# Patient Record
Sex: Female | Born: 1995 | Race: White | Hispanic: No | Smoking: Never smoker
Health system: Southern US, Community
[De-identification: ages and names within clinical notes are randomized; demographics above are authoritative.]

---

## 2019-09-22 ENCOUNTER — Other Ambulatory Visit: Payer: Self-pay

## 2019-09-24 ENCOUNTER — Ambulatory Visit (INDEPENDENT_AMBULATORY_CARE_PROVIDER_SITE_OTHER): Payer: 59 | Admitting: Internal Medicine

## 2019-09-24 ENCOUNTER — Encounter: Payer: Self-pay | Admitting: Internal Medicine

## 2019-09-24 ENCOUNTER — Other Ambulatory Visit: Payer: Self-pay

## 2019-09-24 VITALS — BP 118/76 | HR 112 | Ht 68.0 in | Wt 170.0 lb

## 2019-09-24 DIAGNOSIS — E221 Hyperprolactinemia: Secondary | ICD-10-CM

## 2019-09-24 DIAGNOSIS — D352 Benign neoplasm of pituitary gland: Secondary | ICD-10-CM

## 2019-09-24 LAB — T4, FREE: Free T4: 0.71 ng/dL (ref 0.60–1.60)

## 2019-09-24 LAB — T3, FREE: T3, Free: 3.7 pg/mL (ref 2.3–4.2)

## 2019-09-24 LAB — CORTISOL: Cortisol, Plasma: 17 ug/dL

## 2019-09-24 LAB — TSH: TSH: 1.42 u[IU]/mL (ref 0.35–4.50)

## 2019-09-24 NOTE — Progress Notes (Signed)
Patient ID: Joanne Owens, female   DOB: 01-20-1996, 24 y.o.   MRN: [id number]   This visit occurred during the SARS-CoV-2 public health emergency.  Safety protocols were in place, including screening questions prior to the visit, additional usage of staff PPE, and extensive cleaning of exam room while observing appropriate contact time as indicated for disinfecting solutions.   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  Pt. has been found to have a high prolactin Owens (130) in 10/2016 after she complained of breast discharge and 1 missed period.  She was referred to endocrinology at that time.  She started to see Dr. Iran Owens in 11/2016 (reviewed his notes and corresponding labs).   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  He investigated the rest of her pituitary hormones and they were normal.  These were last checked on 11/01/2018.  At that time, prolactin was normal, at 8.1.  Of note, after diagnosis, she was started on cabergoline 0.5 mg twice weekly.  She is tolerating this well.  Patient denies: -Headaches -Irregular menstrual cycles -Galactorrhea -Weight gain -Acne  Other pertinent tests reviewed:    TSH    0.829  1.639    She is not on Risperdal, Reglan.  She is on OCPs up to 2018 >> had Provera but did not needs this at the end. She had regular menses afterwards, but started OCP at the end on 2018.  However, she is interested in a pregnancy in the near future.  Pt. also has a history of anxiety and depression, for which she sees a Social worker.  She walks for exercise 3 times a week.  ROS: Constitutional: + weight gain (~10 lbs during the coronavirus  pandemic), no weight loss, no fatigue, no subjective hyperthermia, no subjective hypothermia, no nocturia Eyes: no blurry vision, no xerophthalmia ENT: no sore throat, no nodules felt in neck, no dysphagia, no odynophagia, no hoarseness, no tinnitus, no hypoacusis Cardiovascular: no CP, no SOB, no palpitations, no leg swelling Respiratory: no cough, no SOB, no wheezing Gastrointestinal: no N, no V, no D, no C, no acid reflux Musculoskeletal: no muscle, + joint aches Skin: no rash, no hair loss Neurological: no tremors, no numbness or tingling/no dizziness/no HAs Psychiatric: no depression, no anxiety  History reviewed. No pertinent past medical history. History reviewed. No pertinent surgical history. Social History   Socioeconomic History   Marital status: Significant Other    Spouse name: Not on file   Number of children: 0   Years of education: Not on file   Highest education Owens: Not on file  Occupational History   N/a  Tobacco Use   Smoking status: Never Smoker   Smokeless tobacco: Never Used  Substance and Sexual Activity   Alcohol use:  Red wine 1 glass weekly   Drug use: No   Social Determinants of Health   Financial Resource Strain:    Difficulty of Paying Living Expenses: Not on file  Food Insecurity:    Worried About [place] in the Last Year: Not on file   Ran Out of Food in the Last Year: Not on file  Transportation Needs:    Lack of Transportation (Medical): Not on file   Lack of Transportation (Non-Medical): Not  on file  Physical Activity:    Days of Exercise per Week: Not on file   Minutes of Exercise per Session: Not on file  Stress:    Feeling of Stress : Not on file  Social Connections:    Frequency of Communication with Friends and Family: Not on file   Frequency of Social Gatherings with Friends and Family: Not on file   Attends Religious Services: Not on file   Active Member of Clubs or Organizations: Not on file    Attends Archivist Meetings: Not on file   Marital Status: Not on file  Intimate Partner Violence:    Fear of Current or Ex-Partner: Not on file   Emotionally Abused: Not on file   Physically Abused: Not on file   Sexually Abused: Not on file   Current Outpatient Medications on File Prior to Visit  Medication Sig Dispense Refill   cabergoline (DOSTINEX) 0.5 MG tablet Take 0.25 mg by mouth 2 (two) times a week.     escitalopram (LEXAPRO) 10 MG tablet Take by mouth.     KURVELO 0.15-30 MG-MCG tablet Take 1 tablet by mouth daily.     No current facility-administered medications on file prior to visit.   No Known Allergies   History reviewed. No pertinent family history.  PE: BP 118/76    Pulse (!) 112    Ht 5\' 8"  (1.727 m)    Wt 170 lb (77.1 kg)    LMP 09/04/2019    SpO2 96%    BMI 25.85 kg/m  Wt Readings from Last 3 Encounters:  09/24/19 170 lb (77.1 kg)   Constitutional: overweight, in NAD Eyes: PERRLA, EOMI, no exophthalmos ENT: moist mucous membranes, no thyromegaly, no cervical lymphadenopathy Cardiovascular: Tachycardia RR, No MRG Respiratory: CTA B Gastrointestinal: abdomen soft, NT, ND, BS+ Musculoskeletal: no deformities, strength intact in all 4 Skin: moist, warm, no rashes Neurological: no tremor with outstretched hands, DTR normal in all 4  ASSESSMENT: 1.  Hyperprolactinemia - Patient with 3 years history of hyperprolactinemia, diagnosed due to amenorrhea after stopping OCPs in 2018. - I discussed with the patient about possible etiologies of high prolactin levels (to include pituitary adenoma, but also possibly pregnancy, hypothyroidism, stress, exercise, lack of sleep, certain medications, seizures, liver or kidney disease, etc.).  In her case, her pituitary macroadenoma is likely the source of the high prolactin. - we will recheck prolactin today, along with TFTs - We discussed about possible treatments for hyperprolactinemia: Cabergoline and  bromocriptine.  Cabergoline has the advantage of being administered usually 1-2 times a week, is better tolerated, and has beneficial effects on pituitary tumor size.  Bromocriptine is taken several times a day, has more side effects, and has no effect on pituitary tumor size.  She is tolerating Cabergoline well but I would like to try to reduce the dose to only once a week if possible.  I will let her know after the results today return. 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  Her prolactin Owens was 130, which is a little lower than I would have expected from the size of her tumor. - I reviewed the report of the pituitary MRI along with the patient (images are not available to me). I explained that, since the tumor is larger than 1  cm, this qualifies as a macro-, rather than a micro-adenoma.  -This is not a "brain tumor". These tumors are extremely rarely malignant, the vast majority of them are benign.  - a pituitary adenoma can be:   Not producing hormones, not compressing the pituitary gland or the optic chiasm  Not producing hormones, but compressing either the pituitary gland (causing hypopituitarism) or the optic chiasm (causing visual field cuts)  Producing hormones: Prolactinoma, Cushing's disease, acromegaly, gonadotropin secreting tumor, TSH secreting tumor (very rare) - Her prolactin Owens was elevated and the rest of her pituitary hormones were normal, which points towards a prolactin producing tumor, however, I would like to recheck the rest of her pituitary labs to make sure there is no concomitant hormonal dysfunction.  Also, I would like to check a prolactin with dilution since I would have expected a higher prolactin Owens based on the size of her tumor. - I ordered the following  labs: Orders Placed This Encounter  Procedures   MR Brain W Wo Contrast   Insulin-like growth factor   T3, free   T4, free   TSH   ACTH   Cortisol   PROLACTIN W/DILUTION  -Also, we will check another pituitary MRI for 2 reasons: If the pituitary tumor decreased in size, this is most likely a prolactinoma and we can continue with the Cabergoline treatment.  If the tumor did not decrease in size.  It is possible that it may be a nonfunctional adenoma, which may need to be resected. -She is also interested in a pregnancy so I would like to have a baseline of her pituitary tumor size before the pregnancy. -I did advise her that if she is pregnant, she needs to stop cabergoline immediately.  If her tumor remains large, we may need to have visual fields before pregnancy and then in every trimester.  Component     Latest Ref Rng & Units 09/24/2019  IGF-I, LC/MS     83 - 456 ng/mL 208  Z-Score (Female)     -2.0 - 2 SD -0.3  PROLACTIN,UNDILUTED     2.0 - 30.0 ng/mL 3.3  PROLACTIN,DILUTED     ng/mL   Extra tube recieved        Specimen type recieved      Serum;  Triiodothyronine,Free,Serum     2.3 - 4.2 pg/mL 3.7  T4,Free(Direct)     0.60 - 1.60 ng/dL 0.71  TSH     0.35 - 4.50 uIU/mL 1.42  C206 ACTH     6 - 50 pg/mL 11  Cortisol, Plasma     ug/dL 17.0   Her pituitary tests are normal and prolactin is low.  We can back off her cabergoline to 0.25 mg once weekly and repeat her prolactin Owens in 2 months. We will also wait for the results of her MRI.  Philemon Kingdom, MD PhD [facility]

## 2019-09-24 NOTE — Patient Instructions (Signed)
Please stop at the lab.  We will try to get a new MRI.  Continue Cabergoline 0.25 mg 2x a week for now.  Please come back for a follow-up appointment in 1 year.

## 2019-09-30 ENCOUNTER — Encounter: Payer: Self-pay | Admitting: Internal Medicine

## 2019-09-30 LAB — INSULIN-LIKE GROWTH FACTOR
IGF-I, LC/MS: 208 ng/mL (ref 83–456)
Z-Score (Female): -0.3 SD (ref ?–2.0)

## 2019-09-30 LAB — EXTRA SPECIMEN

## 2019-09-30 LAB — PROLACTIN W/DILUTION: PROLACTIN,UNDILUTED: 3.3 ng/mL (ref 2.0–30.0)

## 2019-09-30 LAB — ACTH: C206 ACTH: 11 pg/mL (ref 6–50)

## 2019-10-02 ENCOUNTER — Telehealth: Payer: Self-pay

## 2019-10-02 NOTE — Telephone Encounter (Signed)
-----   Message from Philemon Kingdom, MD sent at 09/30/2019  1:50 PM EST ----- Joanne Owens, can you please call pt:  All her pituitary tests are normal, which is excellent.  Her prolactin is actually quite low, is 3.3, so we can reduce her cabergoline dose to only 0.25 mg weekly.  After this change, I would like to repeat her prolactin level (if she does not become pregnant) in 2 months. I will let her know as soon as her pituitary MRI returns.

## 2019-10-02 NOTE — Telephone Encounter (Signed)
Lab results reviewed by Dr. Cruzita Lederer. Called pt to inform about lab results as well as new orders. LVM requesting returned call. Also wanted to schedule lab appt for follow up labs.

## 2019-10-25 ENCOUNTER — Other Ambulatory Visit: Payer: 59

## 2019-10-30 ENCOUNTER — Ambulatory Visit: Payer: 59

## 2019-10-31 ENCOUNTER — Ambulatory Visit: Payer: 59 | Attending: Internal Medicine

## 2019-10-31 DIAGNOSIS — Z23 Encounter for immunization: Secondary | ICD-10-CM

## 2019-10-31 NOTE — Progress Notes (Signed)
   Covid-19 Vaccination Clinic  Name:  Joanne Owens    MRN: [id number] DOB: 1995-12-13  10/31/2019  [name] was observed post Covid-19 immunization for 15 minutes without incident. She was provided with Vaccine Information Sheet and instruction to access the V-Safe system.   [name] was instructed to call 911 with any severe reactions post vaccine: Marland Kitchen Difficulty breathing  . Swelling of face and throat  . A fast heartbeat  . A bad rash all over body  . Dizziness and weakness   Immunizations Administered    Name Date Dose VIS Date Route   Pfizer COVID-19 Vaccine 10/31/2019  1:10 PM 0.3 mL 07/25/2019 Intramuscular   Manufacturer: [place]   Lot: [id number]   [place]: [id number]

## 2019-11-18 ENCOUNTER — Encounter: Payer: Self-pay | Admitting: Internal Medicine

## 2019-11-19 ENCOUNTER — Other Ambulatory Visit: Payer: Self-pay

## 2019-11-19 DIAGNOSIS — D352 Benign neoplasm of pituitary gland: Secondary | ICD-10-CM

## 2019-11-19 DIAGNOSIS — E221 Hyperprolactinemia: Secondary | ICD-10-CM

## 2019-11-19 MED ORDER — CABERGOLINE 0.5 MG PO TABS: ORAL_TABLET | ORAL | 0 refills | Status: DC

## 2019-11-26 ENCOUNTER — Ambulatory Visit: Payer: 59 | Attending: Internal Medicine

## 2019-11-26 DIAGNOSIS — Z23 Encounter for immunization: Secondary | ICD-10-CM

## 2019-11-26 NOTE — Progress Notes (Signed)
   Covid-19 Vaccination Clinic  Name:  Shericka Swayze    MRN: [id number] DOB: [birth year]  11/26/2019  Ms. Bjelland was observed post Covid-19 immunization for 15 minutes without incident. She was provided with Vaccine Information Sheet and instruction to access the V-Safe system.   Ms. Leibovitz was instructed to call 911 with any severe reactions post vaccine: Marland Kitchen Difficulty breathing  . Swelling of face and throat  . A fast heartbeat  . A bad rash all over body  . Dizziness and weakness   Immunizations Administered    Name Date Dose VIS Date Route   Pfizer COVID-19 Vaccine 11/26/2019  3:10 PM 0.3 mL 07/25/2019 Intramuscular   Manufacturer: [place]   Lot: [id number]   [place]: [id number]

## 2020-01-13 ENCOUNTER — Ambulatory Visit
Admission: RE | Admit: 2020-01-13 | Discharge: 2020-01-13 | Disposition: A | Discharge status: Home / Self Care | Payer: 59 | Source: Ambulatory Visit | Attending: Internal Medicine | Admitting: Internal Medicine

## 2020-01-13 ENCOUNTER — Encounter: Payer: Self-pay | Admitting: Internal Medicine

## 2020-01-13 ENCOUNTER — Other Ambulatory Visit: Payer: Self-pay

## 2020-01-13 DIAGNOSIS — D352 Benign neoplasm of pituitary gland: Secondary | ICD-10-CM

## 2020-01-13 DIAGNOSIS — E221 Hyperprolactinemia: Secondary | ICD-10-CM

## 2020-01-13 MED ORDER — GADOBENATE DIMEGLUMINE 529 MG/ML IV SOLN
7.0000 mL | Freq: Once | INTRAVENOUS | Status: AC | PRN
  Administered 2020-01-13: 7 mL via INTRAVENOUS

## 2020-01-29 ENCOUNTER — Other Ambulatory Visit: Payer: Self-pay

## 2020-01-29 ENCOUNTER — Other Ambulatory Visit (INDEPENDENT_AMBULATORY_CARE_PROVIDER_SITE_OTHER): Payer: 59

## 2020-01-29 DIAGNOSIS — E221 Hyperprolactinemia: Secondary | ICD-10-CM | POA: Diagnosis not present

## 2020-01-29 DIAGNOSIS — D352 Benign neoplasm of pituitary gland: Secondary | ICD-10-CM

## 2020-01-29 LAB — PROLACTIN: Prolactin: 6.2 ng/mL

## 2020-02-04 ENCOUNTER — Other Ambulatory Visit: Payer: Self-pay | Admitting: Internal Medicine

## 2020-02-04 DIAGNOSIS — E221 Hyperprolactinemia: Secondary | ICD-10-CM

## 2020-02-04 DIAGNOSIS — D352 Benign neoplasm of pituitary gland: Secondary | ICD-10-CM

## 2020-02-04 MED ORDER — CABERGOLINE 0.5 MG PO TABS: ORAL_TABLET | ORAL | 1 refills | Status: DC

## 2020-04-21 ENCOUNTER — Other Ambulatory Visit: Payer: Self-pay | Admitting: Internal Medicine

## 2020-04-21 DIAGNOSIS — E221 Hyperprolactinemia: Secondary | ICD-10-CM

## 2020-04-21 DIAGNOSIS — D352 Benign neoplasm of pituitary gland: Secondary | ICD-10-CM

## 2020-08-16 ENCOUNTER — Other Ambulatory Visit: Payer: Self-pay | Admitting: Internal Medicine

## 2020-08-16 DIAGNOSIS — D352 Benign neoplasm of pituitary gland: Secondary | ICD-10-CM

## 2020-08-16 DIAGNOSIS — E221 Hyperprolactinemia: Secondary | ICD-10-CM

## 2020-09-23 ENCOUNTER — Ambulatory Visit: Payer: 59 | Admitting: Internal Medicine

## 2020-11-10 ENCOUNTER — Ambulatory Visit: Payer: 59 | Admitting: Internal Medicine

## 2020-12-17 ENCOUNTER — Ambulatory Visit: Payer: 59 | Admitting: Internal Medicine

## 2020-12-29 ENCOUNTER — Encounter: Payer: Self-pay | Admitting: Internal Medicine

## 2020-12-29 DIAGNOSIS — E221 Hyperprolactinemia: Secondary | ICD-10-CM

## 2020-12-29 DIAGNOSIS — D352 Benign neoplasm of pituitary gland: Secondary | ICD-10-CM

## 2020-12-30 MED ORDER — CABERGOLINE 0.5 MG PO TABS: ORAL_TABLET | ORAL | 0 refills | Status: DC

## 2021-01-13 MED ORDER — CABERGOLINE 0.5 MG PO TABS: ORAL_TABLET | ORAL | 0 refills | Status: AC

## 2021-01-13 NOTE — Addendum Note (Signed)
Addended by: Lauralyn Primes on: 01/13/2021 08:01 PM   Modules accepted: Orders

## 2021-02-11 ENCOUNTER — Ambulatory Visit (INDEPENDENT_AMBULATORY_CARE_PROVIDER_SITE_OTHER): Payer: Medicaid Other | Admitting: Internal Medicine

## 2021-02-11 ENCOUNTER — Encounter: Payer: Self-pay | Admitting: Internal Medicine

## 2021-02-11 ENCOUNTER — Other Ambulatory Visit: Payer: Self-pay

## 2021-02-11 VITALS — BP 110/62 | HR 86 | Ht 68.0 in | Wt 171.0 lb

## 2021-02-11 DIAGNOSIS — D352 Benign neoplasm of pituitary gland: Secondary | ICD-10-CM | POA: Diagnosis not present

## 2021-02-11 DIAGNOSIS — E221 Hyperprolactinemia: Secondary | ICD-10-CM

## 2021-02-11 NOTE — Patient Instructions (Addendum)
Please stop at the lab.  Continue Cabergoline 0.25 mg 2x a week.  Please come back for a follow-up appointment in 1 year. 

## 2021-02-11 NOTE — Progress Notes (Signed)
Patient ID: Joanne Owens, female   DOB: 12-16-1995, 25 y.o.   MRN: [id number]   This visit occurred during the SARS-CoV-2 public health emergency.  Safety protocols were in place, including screening questions prior to the visit, additional usage of staff PPE, and extensive cleaning of exam room while observing appropriate contact time as indicated for disinfecting solutions.   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  Last visit 1 year and 4 months ago.  Interim history: She has been doing well since last visit, without complaints.  She came off OCPs and her cycles are regular. At last visit, we tried to decrease the Cabergoline dose to half a tablet once a week but she developed some galactorrhea and went back to twice a week.  She continues on this dose.  No headaches, visual disturbance, or galactorrhea on this dose.  Reviewed and addended history: Pt. has been found to have a high prolactin level (130) in 10/2016 after she complained of breast discharge and 1 missed period.  She was referred to endocrinology at that time.  She started to see Dr. Iran Planas in 11/2016 (reviewed his notes and corresponding labs).   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  He investigated the rest of her pituitary hormones and they were normal in 11/01/2018.  At that time, prolactin was normal, at 8.1.  Of note, after diagnosis, she was started on cabergoline 0.5 mg twice weekly.    When I saw her first, I ordered a pituitary MRI (01/13/2020): Pituitary tumor significantly decreased in size, now measuring only 4 mm.  Also, all pituitary labs were again normal in 09/2019, including a prolactin level, which  was actually low: Component     Latest Ref Rng & Units 09/24/2019  IGF-I, LC/MS     83 - 456 ng/mL 208  Z-Score (Female)     -2.0 - 2 SD -0.3  PROLACTIN,UNDILUTED     2.0 - 30.0 ng/mL 3.3  PROLACTIN,DILUTED     ng/mL   Extra tube recieved        Specimen type recieved      Serum;  Triiodothyronine,Free,Serum     2.3 - 4.2 pg/mL 3.7  T4,Free(Direct)     0.60 - 1.60 ng/dL 0.71  TSH     0.35 - 4.50 uIU/mL 1.42  C206 ACTH     6 - 50 pg/mL 11  Cortisol, Plasma     ug/dL 17.0  She did not have evidence of macroprolactin.  We decreased her Cabergoline dose to 0.25 mg weekly.  However, she developed breast discharge and increase the dose back to twice weekly She continues on this dose.  She is off OCPs >> regular menses.  Patient denies: -Headaches -Galactorrhea -Weight gain -Acne  Other pertinent tests reviewed:    TSH    0.829  1.639    She is not on Risperdal, Reglan.  She was on OCPs up to 2018 >> had Provera but did not needs this at the end. She had regular menses afterwards, but started OCP at the end on 2018.  Now off again.  However, she is interested in a pregnancy in the near future-now using condoms.  Pt. also has a history of anxiety and depression, for which  she sees a Social worker.  She walks for exercise 3 times a week.  ROS: Constitutional: No weight gain, no weight loss, no fatigue, no subjective hyperthermia, no subjective hypothermia, no nocturia Eyes: no blurry vision, no xerophthalmia ENT: no sore throat, no nodules felt in neck, no dysphagia, no odynophagia, no hoarseness, no tinnitus, no hypoacusis Cardiovascular: no CP, no SOB, no palpitations, no leg swelling Respiratory: no cough, no SOB, no wheezing Gastrointestinal: no N, no V, no D, no C, no acid reflux Musculoskeletal: no muscle, no joint aches Skin: no rash, no hair loss Neurological: no tremors, no numbness or tingling/no dizziness/no HAs  No past medical history on file. No past  surgical history on file. Social History   Socioeconomic History   Marital status: Significant Other    Spouse name: Not on file   Number of children: 0   Years of education: Not on file   Highest education level: Not on file  Occupational History   N/a  Tobacco Use   Smoking status: Never Smoker   Smokeless tobacco: Never Used  Substance and Sexual Activity   Alcohol use:  Red wine 1 glass weekly   Drug use: No   Social Determinants of Health   Financial Resource Strain:    Difficulty of Paying Living Expenses: Not on file  Food Insecurity:    Worried About [place] in the Last Year: Not on file   Ran Out of Food in the Last Year: Not on file  Transportation Needs:    Lack of Transportation (Medical): Not on file   Lack of Transportation (Non-Medical): Not on file  Physical Activity:    Days of Exercise per Week: Not on file   Minutes of Exercise per Session: Not on file  Stress:    Feeling of Stress : Not on file  Social Connections:    Frequency of Communication with Friends and Family: Not on file   Frequency of Social Gatherings with Friends and Family: Not on file   Attends Religious Services: Not on file   Active Member of Clubs or Organizations: Not on file   Attends Archivist Meetings: Not on file   Marital Status: Not on file  Intimate Partner Violence:    Fear of Current or Ex-Partner: Not on file   Emotionally Abused: Not on file   Physically Abused: Not on file   Sexually Abused: Not on file   Current Outpatient Medications on File Prior to Visit  Medication Sig   cabergoline (DOSTINEX) 0.5 MG tablet TAKE 1/2 TABLET(0.25 MG) BY MOUTH 2x EVERY WEEK   escitalopram (LEXAPRO) 10 MG tablet Take by mouth.   No current facility-administered medications on file prior to visit.   No Known Allergies   No family history on file.  PE: BP 110/62   Pulse 86   Ht 5\' 8"  (1.727 m)   Wt 171 lb (77.6 kg)   LMP 01/23/2021   SpO2 96%   BMI  26.00 kg/m  Wt Readings from Last 3 Encounters:  02/11/21 171 lb (77.6 kg)  09/24/19 170 lb (77.1 kg)   Constitutional: overweight, in NAD Eyes: PERRLA, EOMI, no exophthalmos ENT: moist mucous membranes, no thyromegaly, no cervical lymphadenopathy Cardiovascular: RRR, No MRG Respiratory: CTA B Gastrointestinal: abdomen soft, NT, ND, BS+ Musculoskeletal: no deformities, strength intact in all 4 Skin: moist, warm, no rashes Neurological: no tremor with outstretched hands, DTR normal in all 4  ASSESSMENT: 1.  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  Pituitary hormones are otherwise normal. -We checked her for macro prolactin and investigation was negative -She was started on Cabergoline 0.5 mg weekly initially, which we decreased at last visit 0.25 mg weekly.  Due to some galactorrhea she increase the dose back to 0.5 mg weekly >> she continues on this dose. -no galactorrhea today and menses are regular -We will recheck the prolactin level today  2. Pituitary adenoma -Patient with history of pituitary microadenoma, as mentioned above, found during investigation for high prolactin level.  Initial prolactin level was 130. -She was started on Cabergoline which was able to shrink the size of her tumor -On the most recent MRI from 01/2020, her pituitary tumor only measured 4 mm, decreased from 1.3 cm.  I explained that this is an excellent result. -Indeed, her prolactin level decreased significantly and it was low at last visit, after which we decreased the Cabergoline dose to 0.25 mg weekly.  Interestingly, she had galactorrhea on this, as mentioned above so she increase the dose to 0.25 milligrams twice a week. -Since the rest of the pituitary hormones were normal, this tumor does not appear to be  secreting other hormones, for example growth hormone -no HAs, visual field cuts -At today's visit, we will repeat her prolactin level -We do not need to repeat her MRI for now, especially if prolactin level remains low -At last visit she was interested in pregnancy.  She continues to be interested in this.  I advised her to stop Cabergoline immediately after she finds out she is pregnant -No MRIs are indicated during the pregnancy, however, if she starts having headaches or other signs of hyperprolactinemia, she may need to have a visual field checked.  Component     Latest Ref Rng & Units 02/11/2021  Prolactin     ng/mL 9.8  Normal prolactin level.  For now, I would suggest to continue the current dose of Cabergoline.  Philemon Kingdom, MD PhD [facility]

## 2021-02-12 LAB — PROLACTIN: Prolactin: 9.8 ng/mL

## 2021-02-21 ENCOUNTER — Encounter: Payer: Self-pay | Admitting: Internal Medicine

## 2021-06-26 IMAGING — MR MR HEAD WO/W CM
14 of 19 series · 33 of 48 positions shown · IV contrast (7ml multihance)
Comparison: 11/28/2016.

CLINICAL DATA: 23-year-old female with hyperprolactinemia,
pituitary macroadenoma on 6936 MRI.

EXAM:
MRI HEAD WITHOUT AND WITH CONTRAST
TECHNIQUE: Multiplanar, multiecho pulse sequences of the brain and surrounding
structures were obtained without and with intravenous contrast.
CONTRAST:  7mL MULTIHANCE GADOBENATE DIMEGLUMINE 529 MG/ML IV SOLN

[Series 3: T1 · sagittal · 5.0mm · 0.45mm/px · 3 of 22 slices shown]
[im 1/22]
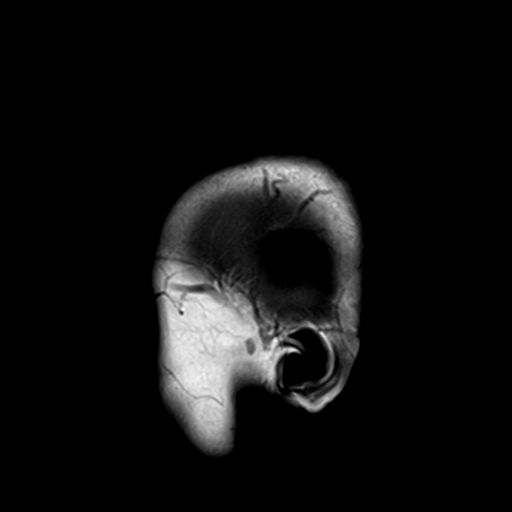
[im 11/22]
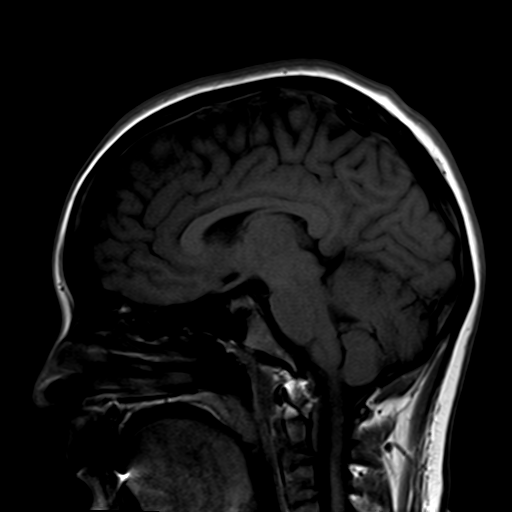
[im 22/22]
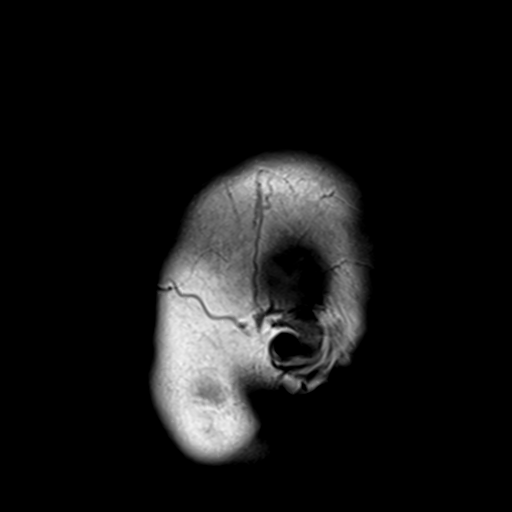

[Series 4: DWI · axial · 3.0mm · 1.80mm/px · z∈[-49,+98]mm · 8 of 100 slices shown]
[im 1/100]
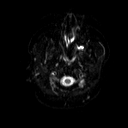
[im 12/100]
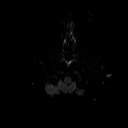
[im 34/100]
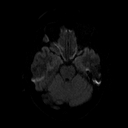
[im 45/100]
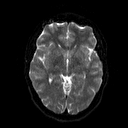
[im 56/100]
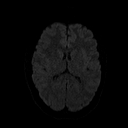
[im 67/100]
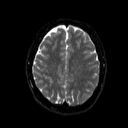
[im 89/100]
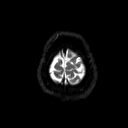
[im 100/100]
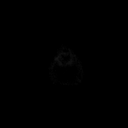

[Series 5: dwi_adc · axial · 3.0mm · 1.80mm/px · z∈[-49,+98]mm · 5 of 49 slices shown]
[im 1/49]
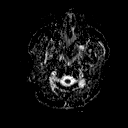
[im 13/49]
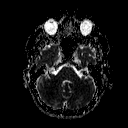
[im 25/49]
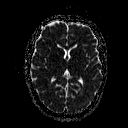
[im 37/49]
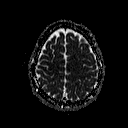
[im 49/49]
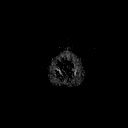

[Series 6: T2 · axial · 5.0mm · 0.36mm/px · z∈[-57,+92]mm · 2 of 24 slices shown]
[im 1/24]
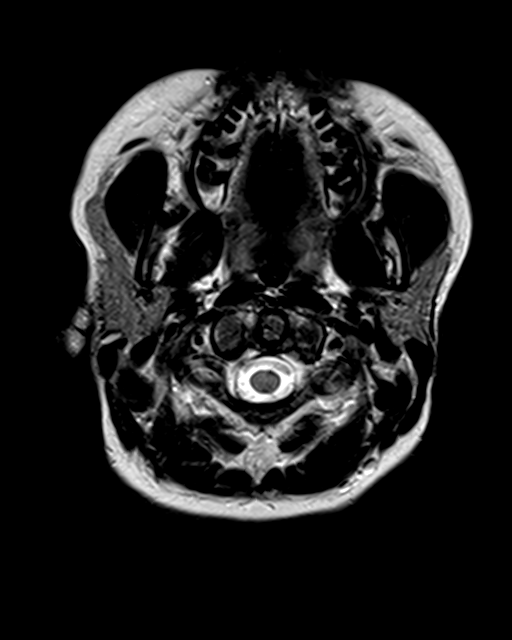
[im 24/24]
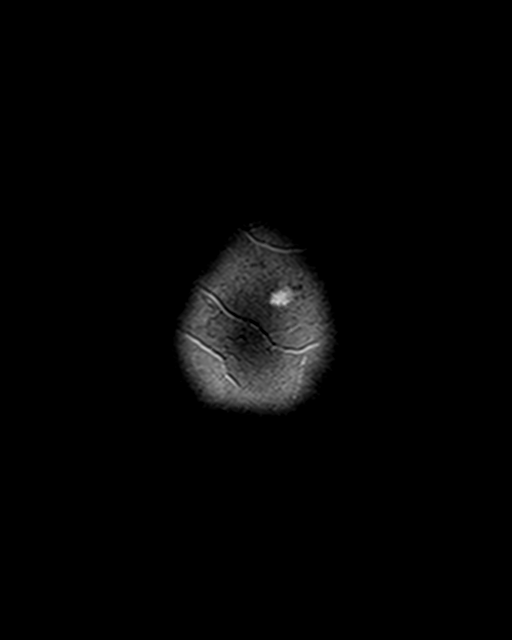

[Series 7: FLAIR · axial · 3.0mm · 0.45mm/px · z∈[-67,+77]mm · 3 of 32 slices shown]
[im 1/32]
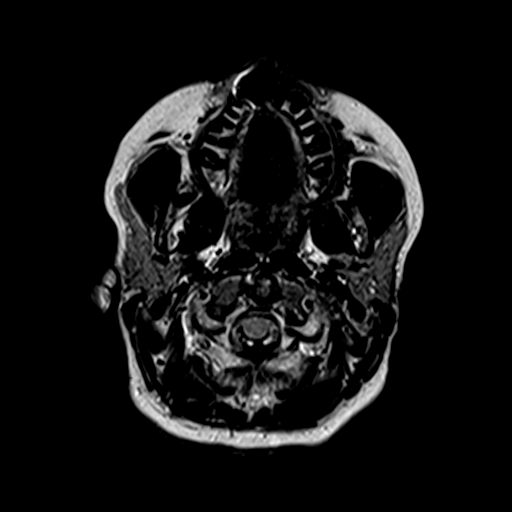
[im 16/32]
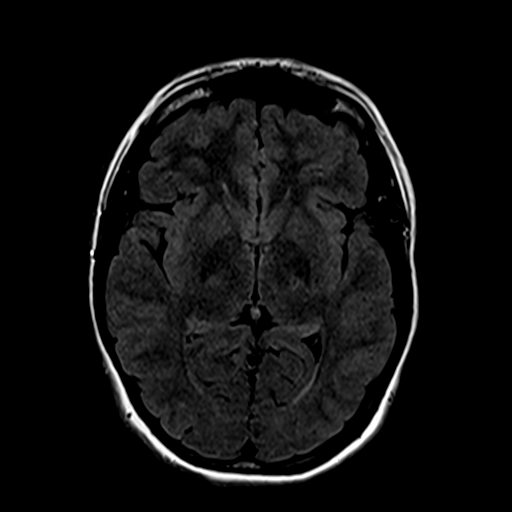
[im 32/32]
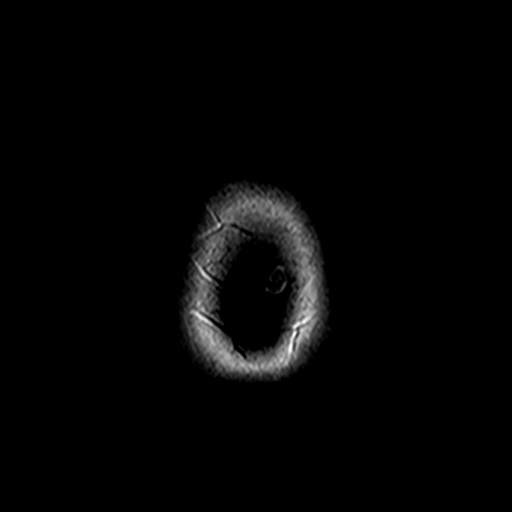

[Series 9: swi_images · axial · 4.0mm · 0.94mm/px · z∈[-68,+87]mm · 4 of 40 slices shown]
[im 1/40]
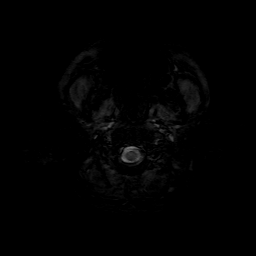
[im 14/40]
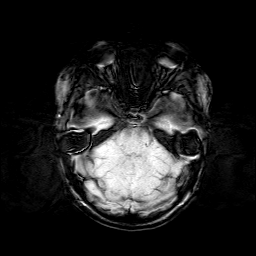
[im 27/40]
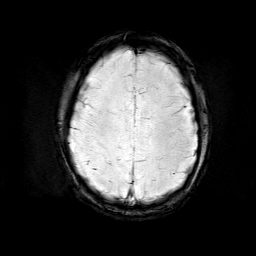
[im 40/40]
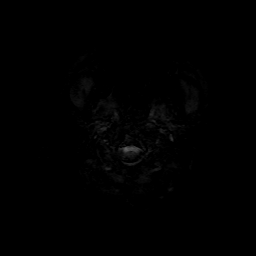

[Series 10: sag 3mm · sagittal · 3.0mm · 0.33mm/px · 1 of 11 slices shown]
[im 1/11]
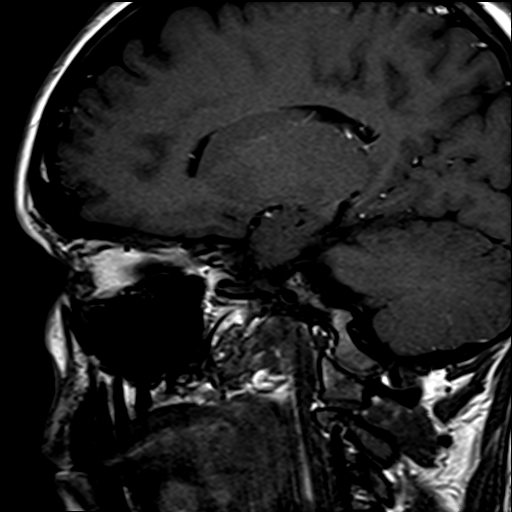

[Series 11: cor 3mm · coronal · 3.0mm · 0.33mm/px · 1 of 11 slices shown]
[im 1/11]
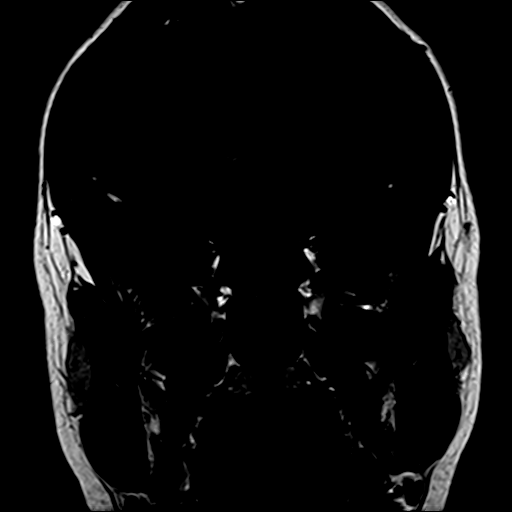

[Series 12: pre cor dynamic · coronal · non-contrast · 3.0mm · 0.35mm/px · 1 of 8 slices shown]
[im 1/8]
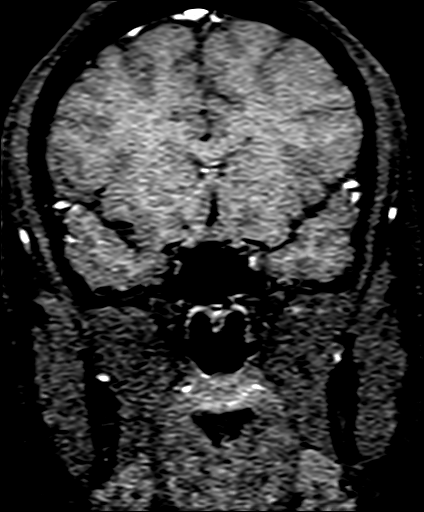

[Series 13: post fs cor · coronal · 3.0mm · 0.35mm/px · 1 of 8 slices shown (1 of 5)]
[im 1/8]
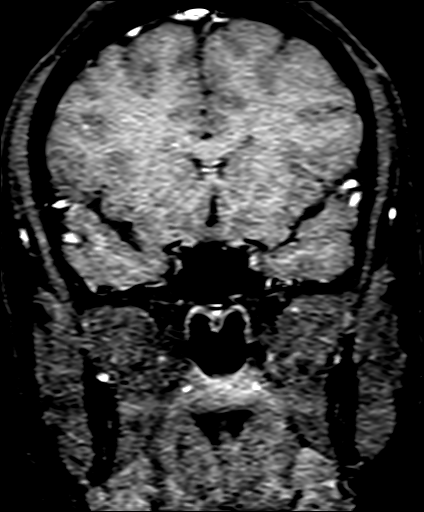

[Series 14: post fs cor · coronal · 3.0mm · 0.35mm/px · 1 of 8 slices shown (2 of 5)]
[im 1/8]
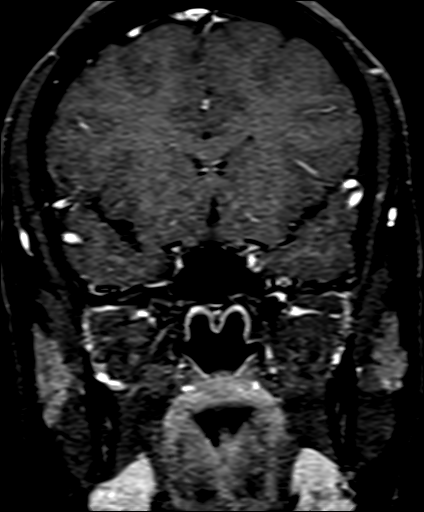

[Series 15: post fs cor · coronal · 3.0mm · 0.35mm/px · 1 of 8 slices shown (3 of 5)]
[im 1/8]
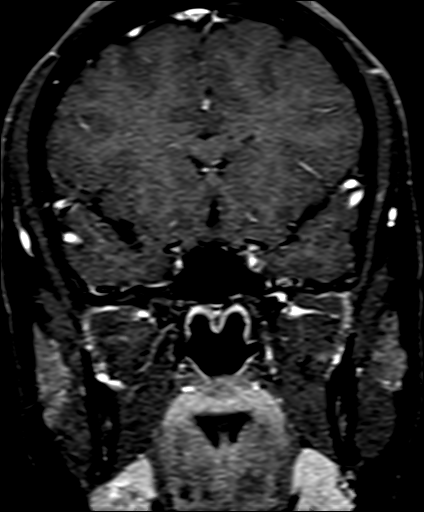

[Series 16: post fs cor · coronal · 3.0mm · 0.35mm/px · 1 of 8 slices shown (4 of 5)]
[im 1/8]
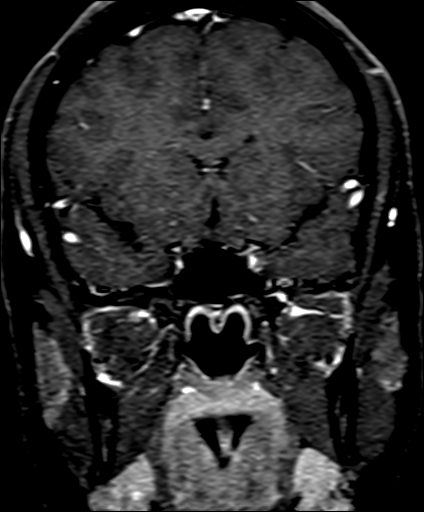

[Series 17: post fs cor · coronal · 3.0mm · 0.35mm/px · 1 of 8 slices shown (5 of 5)]
[im 1/8]
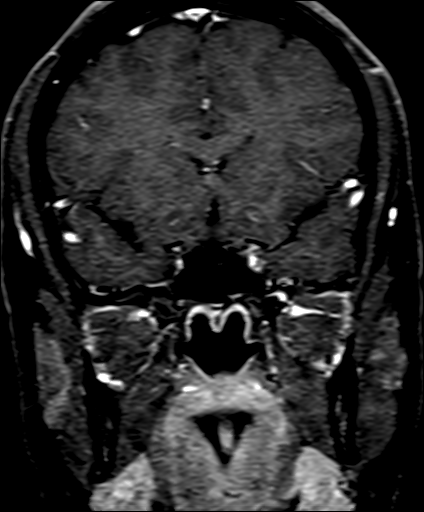

[33 of 48 positions shown; findings below may reference images not displayed]

FINDINGS: Brain: Pituitary details are below.

No restricted diffusion to suggest acute infarction. No midline
shift, mass effect, evidence of mass lesion, ventriculomegaly,
extra-axial collection or acute intracranial hemorrhage.
Cervicomedullary junction within normal limits.

Cerebral volume, gray and white matter signal remain normal. No
encephalomalacia or chronic cerebral blood products identified. No
abnormal gray or white matter enhancement. No dural thickening.

Vascular: Major intracranial vascular flow voids are stable.

Skull and upper cervical spine: Visible cervical spine and bone
marrow signal remain within normal limits.

Sinuses/Orbits: Stable and negative. Mastoid air cells remain clear.
Visible internal auditory structures appear normal. Mildly high
riding left IJ bulb again noted (normal variant).

Other: Dedicated pituitary imaging. Normalized overall pituitary
size and configuration since 6936. Along the left lateral aspect of
the gland a small 4 mm hypoenhancing area appears to be the remnant
of the 13 mm macro adenoma demonstrated in 6936. See series 15,
image 5 and series 20, image 6 today. The remainder of the gland is
homogeneously enhancing. The cavernous sinus remains normal. No
suprasellar mass or mass effect. Normal infundibulum. Normal
hypothalamus.
IMPRESSION: 1. Substantially regressed pituitary macroadenoma since 6936. Subtle
4 mm residual noted along the left lateral aspect of the gland now.
No cavernous sinus involvement or complicating features.

2. Elsewhere continued normal MRI appearance of the brain.

## 2021-08-16 ENCOUNTER — Ambulatory Visit: Payer: Medicaid Other | Admitting: Internal Medicine

## 2021-08-16 NOTE — Progress Notes (Deleted)
Patient ID: Joanne Owens, female   DOB: 10-07-1995, 26 y.o.   MRN: [id number]   This visit occurred during the SARS-CoV-2 public health emergency.  Safety protocols were in place, including screening questions prior to the visit, additional usage of staff PPE, and extensive cleaning of exam room while observing appropriate contact time as indicated for disinfecting solutions.   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  Last visit 6 months ago.  Interim history: She is pregnant, ** weeks along. She stopped Cabergoline as soon as she had a positive pregnancy test, as discussed at last visit. No headaches, galactorrhea, visual disturbances.  Reviewed history: Pt. has been found to have a high prolactin level (130) in 10/2016 after she complained of breast discharge and 1 missed period.  She was referred to endocrinology at that time.  She started to see Dr. Iran Planas in 11/2016 (reviewed his notes and corresponding labs).   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  He investigated the rest of her pituitary hormones and they were normal in 11/01/2018.  At that time, prolactin was normal, at 8.1.  Of note, after diagnosis, she was started on cabergoline 0.5 mg twice weekly.    When I saw her first, I ordered a pituitary MRI (01/13/2020): Pituitary tumor significantly decreased in size, now measuring only 4 mm.  Also, all pituitary labs were again normal in 09/2019, including a prolactin level, which was actually low: Component     Latest Ref Rng & Units 09/24/2019  IGF-I, LC/MS     83 - 456 ng/mL 208  Z-Score (Female)     -2.0 - 2 SD -0.3  PROLACTIN,UNDILUTED     2.0 - 30.0 ng/mL 3.3   PROLACTIN,DILUTED     ng/mL   Extra tube recieved        Specimen type recieved      Serum;  Triiodothyronine,Free,Serum     2.3 - 4.2 pg/mL 3.7  T4,Free(Direct)     0.60 - 1.60 ng/dL 0.71  TSH     0.35 - 4.50 uIU/mL 1.42  C206 ACTH     6 - 50 pg/mL 11  Cortisol, Plasma     ug/dL 17.0  She did not have evidence of macroprolactin.  We decreased her Cabergoline dose to 0.25 mg weekly.  However, she developed breast discharge and increase the dose back to twice weekly.  Now off after positive pregnancy test.  Other pertinent tests reviewed:    TSH    0.829  1.639    She is not on Risperdal, Reglan.   She was on OCPs up to 2018 >> had Provera but did not needs this at the end. She had regular menses afterwards, but started OCP at the end on 2018 >> then came off again  Pt. also has a history of anxiety and depression, for which she sees a Social worker.  She walks for exercise 3 times a week.  ROS: + see HPI  No past medical history on file. No past surgical history on file. Social History   Socioeconomic History   Marital status: Significant Other    Spouse name: Not on file   Number of children: 0   Years of education: Not on file   Highest  education level: Not on file  Occupational History   N/a  Tobacco Use   Smoking status: Never Smoker   Smokeless tobacco: Never Used  Substance and Sexual Activity   Alcohol use:  Red wine 1 glass weekly   Drug use: No   Social Determinants of Health   Financial Resource Strain:    Difficulty of Paying Living Expenses: Not on file  Food Insecurity:    Worried About [place] in the Last Year: Not on file   Ran Out of Food in the Last Year: Not on file  Transportation Needs:    Lack of Transportation (Medical): Not on file   Lack of Transportation (Non-Medical): Not on file  Physical Activity:    Days of Exercise per Week: Not on file   Minutes of Exercise per Session: Not on file  Stress:    Feeling of  Stress : Not on file  Social Connections:    Frequency of Communication with Friends and Family: Not on file   Frequency of Social Gatherings with Friends and Family: Not on file   Attends Religious Services: Not on file   Active Member of [employer] or Organizations: Not on file   Attends Archivist Meetings: Not on file   Marital Status: Not on file  Intimate Partner Violence:    Fear of Current or Ex-Partner: Not on file   Emotionally Abused: Not on file   Physically Abused: Not on file   Sexually Abused: Not on file   Current Outpatient Medications  Medication Sig Dispense Refill   amphetamine-dextroamphetamine (ADDERALL) 10 MG tablet 0.5 tablet     cabergoline (DOSTINEX) 0.5 MG tablet TAKE 1/2 TABLET(0.25 MG) BY MOUTH 2 TIMES EVERY WEEK 15 tablet 0   escitalopram (LEXAPRO) 10 MG tablet Take by mouth.     KURVELO 0.15-30 MG-MCG tablet Take 1 tablet by mouth daily.     lamoTRIgine (LAMICTAL) 100 MG tablet Take by mouth.     montelukast (SINGULAIR) 10 MG tablet Take 10 mg by mouth at bedtime.     No current facility-administered medications for this visit.     No current facility-administered medications on file prior to visit.   No Known Allergies   No family history on file.  PE: There were no vitals taken for this visit. Wt Readings from Last 3 Encounters:  02/11/21 171 lb (77.6 kg)  09/24/19 170 lb (77.1 kg)   Constitutional: in NAD Eyes: PERRLA, EOMI, no exophthalmos ENT: moist mucous membranes, no thyromegaly, no cervical lymphadenopathy Cardiovascular: RRR, No MRG Respiratory: CTA B Musculoskeletal: no deformities, strength intact in all 4 Skin: moist, warm, no rashes Neurological: no tremor with outstretched hands, DTR normal in all 4  ASSESSMENT: 1.  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  Pituitary hormones are otherwise normal. -Repeat MRI showed a significant increase in size of the pituitary tumor in 01/2020 -We checked her for macro prolactin in the investigation was negative  -due to galactorrhea she had to increase the dose back to 0.5 mg weekly.  She stopped this as soon as she found out she was pregnant. -No galactorrhea -We will not recheck the prolactin level today  2. Pituitary adenoma -Patient with a history of pituitary microadenoma, as mentioned above, found during investigation for high prolactin level.  The initial prolactin level  was 130. -She was started on Cabergoline, which was able to shrink the size of her tumor -Her most recent MRI is from 01/2020.  This showed that her pituitary tumor only measured 4 mm, decreased from 1.3 cm.  There is an excellent result. -Her prolactin level also decreased significantly and it was 9.8 at our last visit from 02/2021. -We continued Cabergoline, 0.25 mg twice a week.  She was previously able to decrease the dose to only once a week after prolactin level returned low, but she started to have galactorrhea so we increased the dose back. -She contacted me since last visit that she was pregnant.  Per our previous discussion, she remembered to stop Cabergoline right after she had a pregnancy test.  She continues off Cabergoline now. -We discussed that during pregnancy, there is no need to check prolactin levels as they will be elevated -There is no indication to follow her with noncontrasted MRI or visual fields since she had a micro prolactinoma, rather than the macroprolactinoma, however, I did advise her that if she does develop more significant headaches or visual disturbances, we may need to check these -At today's visit, she is asymptomatic -Of note, we checked her pituitary hormones before and they appeared to be normal, without any sign of hypopituitarism or growth hormone co-secretion.   We will not repeat these for now. -We discussed about returning to the clinic after the pregnancy and also few months after she stops breast-feeding.  Elevated prolactin can be encountered up to one 1 year after pregnancy and breast-feeding.   Philemon Kingdom, MD PhD [facility] Endocrinology
